# Patient Record
Sex: Male | Born: 1960 | Race: White | Hispanic: No | Marital: Married | Smoking: Never smoker
Health system: Southern US, Community
[De-identification: ages and names within clinical notes are randomized; demographics above are authoritative.]

## PROBLEM LIST (undated history)

## (undated) DIAGNOSIS — T7840XA Allergy, unspecified, initial encounter: Secondary | ICD-10-CM

## (undated) DIAGNOSIS — K645 Perianal venous thrombosis: Secondary | ICD-10-CM

## (undated) HISTORY — DX: Perianal venous thrombosis: K64.5

## (undated) HISTORY — DX: Allergy, unspecified, initial encounter: T78.40XA

---

## 2004-07-04 ENCOUNTER — Ambulatory Visit (HOSPITAL_COMMUNITY): Admission: RE | Admit: 2004-07-04 | Discharge: 2004-07-04 | Payer: Self-pay | Admitting: *Deleted

## 2012-05-24 ENCOUNTER — Emergency Department (INDEPENDENT_AMBULATORY_CARE_PROVIDER_SITE_OTHER)
Admission: EM | Admit: 2012-05-24 | Discharge: 2012-05-24 | Disposition: A | Discharge status: Home / Self Care | Payer: Managed Care, Other (non HMO) | Source: Home / Self Care

## 2012-05-24 DIAGNOSIS — S61219A Laceration without foreign body of unspecified finger without damage to nail, initial encounter: Secondary | ICD-10-CM

## 2012-05-24 DIAGNOSIS — S61209A Unspecified open wound of unspecified finger without damage to nail, initial encounter: Secondary | ICD-10-CM

## 2012-05-24 DIAGNOSIS — Z23 Encounter for immunization: Secondary | ICD-10-CM

## 2012-05-24 MED ORDER — TETANUS-DIPHTH-ACELL PERTUSSIS 5-2.5-18.5 LF-MCG/0.5 IM SUSP
0.5000 mL | Freq: Once | INTRAMUSCULAR | Status: AC
  Administered 2012-05-24: 0.5 mL via INTRAMUSCULAR

## 2012-05-24 NOTE — ED Provider Notes (Signed)
History     CSN: [id number]  Arrival date & time 05/24/12  1359   First MD Initiated Contact with Patient 05/24/12 1400      Chief Complaint  Patient presents with  . Laceration    today   HPI Comments: Pt was sharpening lawn mower blades. Had leather gloves on. Lawn mower blade slipped and cut him across R 3rd finger. Blade cut through leather gloves. Had superficial wound that bled small amount. Pt put a splint over finger. Bleeding returned. Pt states that he has not had a tetanus shot in > 10 years. Pt also wanted to make sure there were tendon/joint damage from injury.    Patient is a 51 y.o. male presenting with skin laceration.  Laceration  The incident occurred less than 1 hour ago. The laceration is located on the right hand. Size: 1.5 cm (superficial)    History reviewed. No pertinent past medical history.  History reviewed. No pertinent past surgical history.  Family History  Problem Relation Age of Onset  . Hypertension Mother     History  Substance Use Topics  . Smoking status: Never Smoker   . Smokeless tobacco: Never Used  . Alcohol Use: No      Review of Systems  All other systems reviewed and are negative.    Allergies  Review of patient's allergies indicates no known allergies.  Home Medications   Current Outpatient Rx  Name Route Sig Dispense Refill  . ASPIRIN 325 MG PO TABS Oral Take 325 mg by mouth as needed.      BP 116/76  Pulse 68  Temp 97.7 F (36.5 C) (Oral)  Resp 17  Ht 5\' 10"  (1.778 m)  Wt 160 lb (72.576 kg)  BMI 22.96 kg/m2  SpO2 100%  Physical Exam  Constitutional: He appears well-developed and well-nourished.  HENT:  Head: Normocephalic and atraumatic.  Eyes: Conjunctivae are normal. Pupils are equal, round, and reactive to light.  Neck: Normal range of motion. Neck supple.  Cardiovascular: Normal rate and regular rhythm.   Pulmonary/Chest: Effort normal and breath sounds normal.  Abdominal: Soft. Bowel sounds are  normal.  Musculoskeletal:       Hands:      Superficial laceration measuring around 1-1.5 cm across 1st IP joint of R 3rd metacarpal.  Laceration does not go below dermis    ED Course  LACERATION REPAIR Performed by: Doree Albee Authorized by: Doree Albee Consent: Verbal consent obtained. Patient understanding: patient states understanding of the procedure being performed Location: R 3rd metacarpal  Foreign bodies: no foreign bodies Tendon involvement: none Nerve involvement: none Vascular damage: no Patient sedated: no Irrigation solution: Area soaked and cleansed with chlorhexidine solution. Irrigation method: soaking, manual debridement.  Amount of cleaning: standard Debridement: minimal Degree of undermining: none Skin closure: glue Dressing: gauze roll Patient tolerance: Patient tolerated the procedure well with no immediate complications.   (including critical care time)  Labs Reviewed - No data to display No results found.   1. Need for tetanus booster   2. Finger laceration       MDM  Area manually cleansed and debrided at bedside.  No subdermal or joint space/tendinous involvement. Full ROM.  Dermabond applied to affected area.  Tetanus shot given.  Discussed general care and infectious red flags.  Follow up as needed.      The patient and/or caregiver has been counseled thoroughly with regard to treatment plan and/or medications prescribed including dosage, schedule, interactions, rationale for use,  and possible side effects and they verbalize understanding. Diagnoses and expected course of recovery discussed and will return if not improved as expected or if the condition worsens. Patient and/or caregiver verbalized understanding.             Doree Albee, MD 05/24/12 [phone]

## 2012-05-24 NOTE — ED Notes (Signed)
Treg cut his 3 rd finger on his right hand today. He was trying to sharpen a mower blade and the blade cut through his glove.

## 2014-07-09 ENCOUNTER — Ambulatory Visit
Admission: RE | Admit: 2014-07-09 | Discharge: 2014-07-09 | Disposition: A | Discharge status: Home / Self Care | Payer: Managed Care, Other (non HMO) | Source: Ambulatory Visit | Attending: Family Medicine | Admitting: Family Medicine

## 2014-07-09 ENCOUNTER — Other Ambulatory Visit: Payer: Self-pay | Admitting: Family Medicine

## 2014-07-09 DIAGNOSIS — M25532 Pain in left wrist: Secondary | ICD-10-CM

## 2015-07-30 IMAGING — CR DG WRIST COMPLETE 3+V*L*
4 series · 4 of 4 positions shown · non-contrast
Comparison: None.

CLINICAL DATA: Left wrist pain

EXAM:
LEFT WRIST - COMPLETE 3+ VIEW

[view not recorded (1 of 4)]
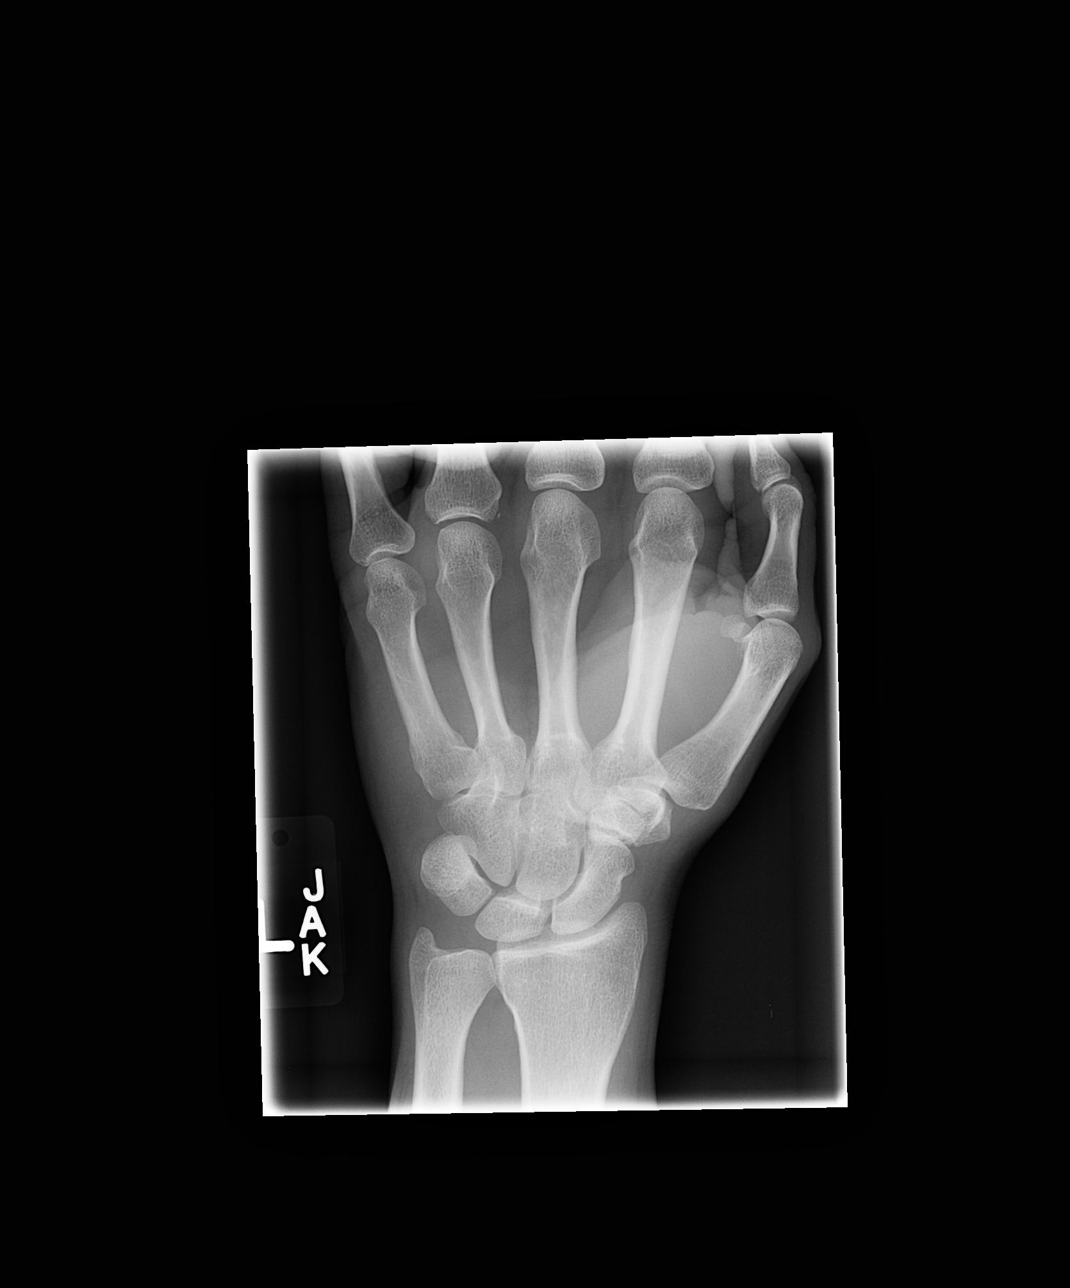

[view not recorded (2 of 4)]
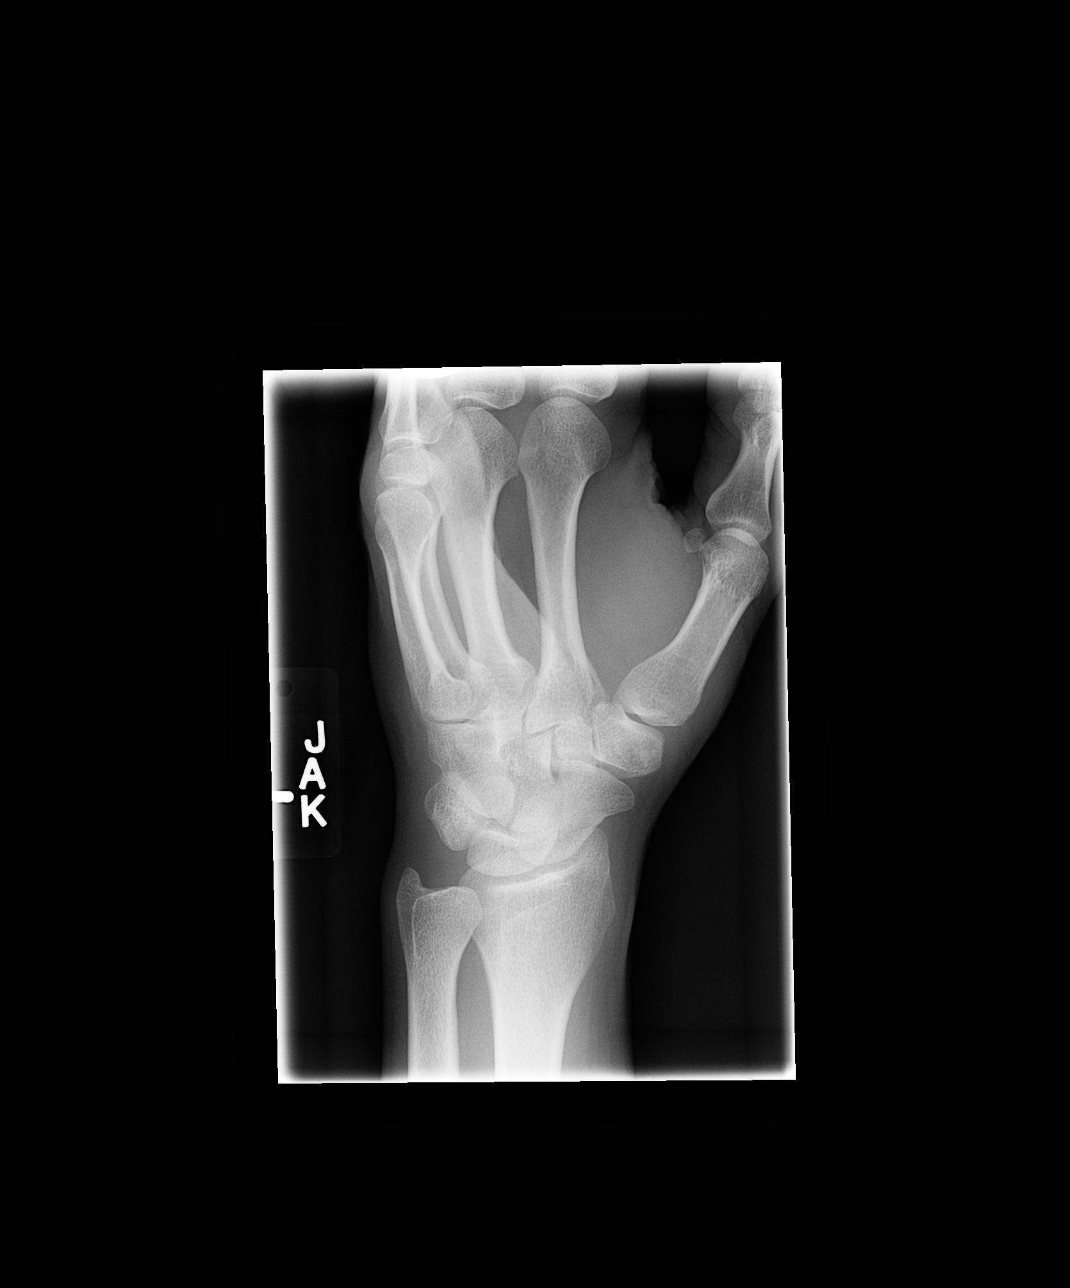

[view not recorded (3 of 4)]
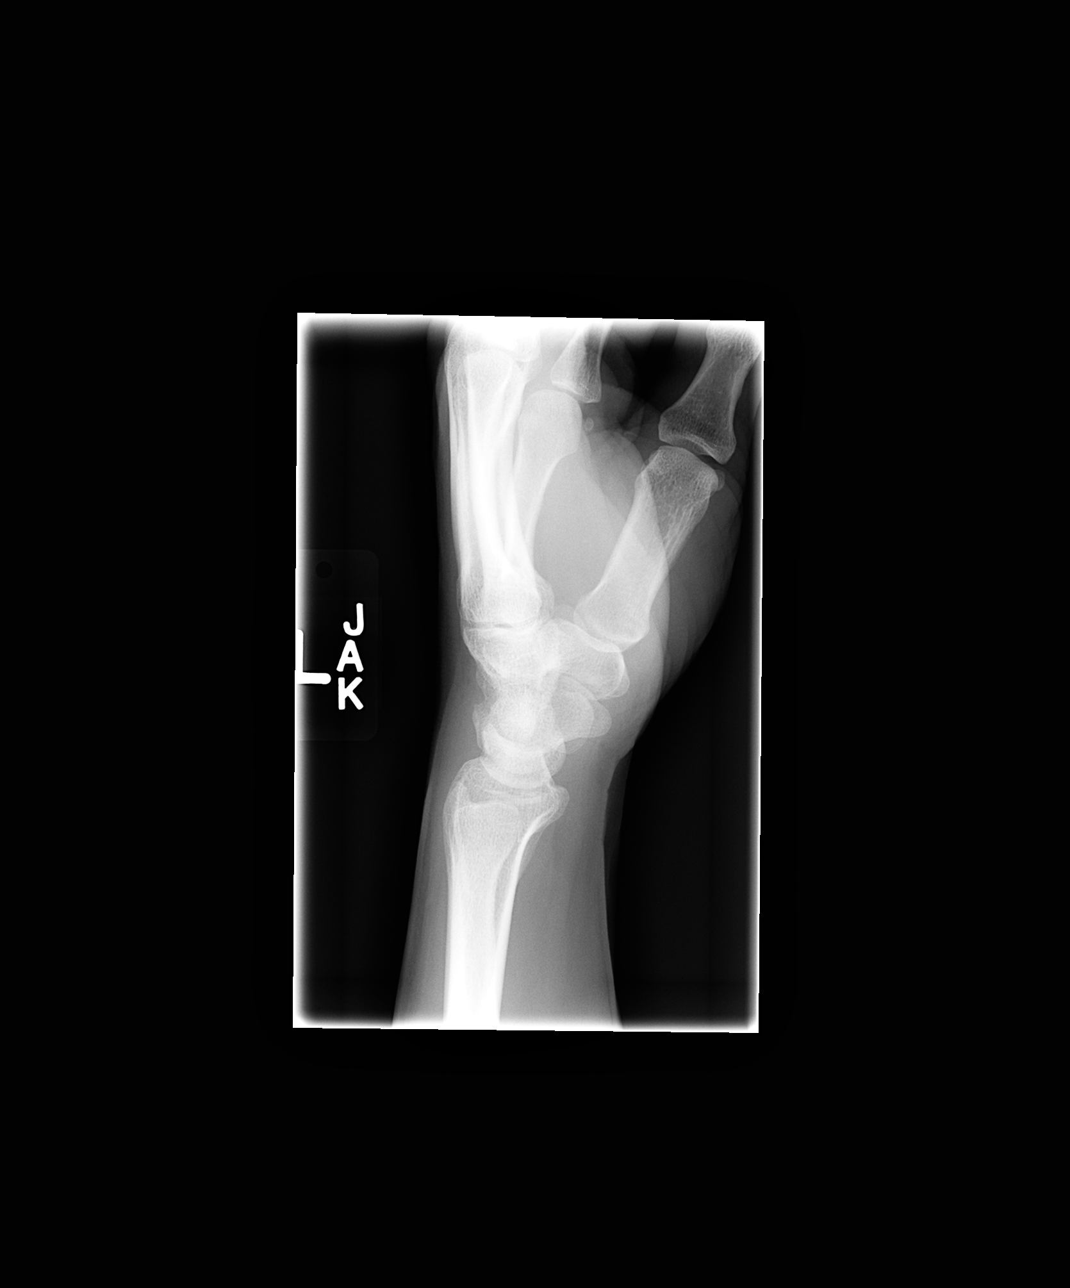

[view not recorded (4 of 4)]
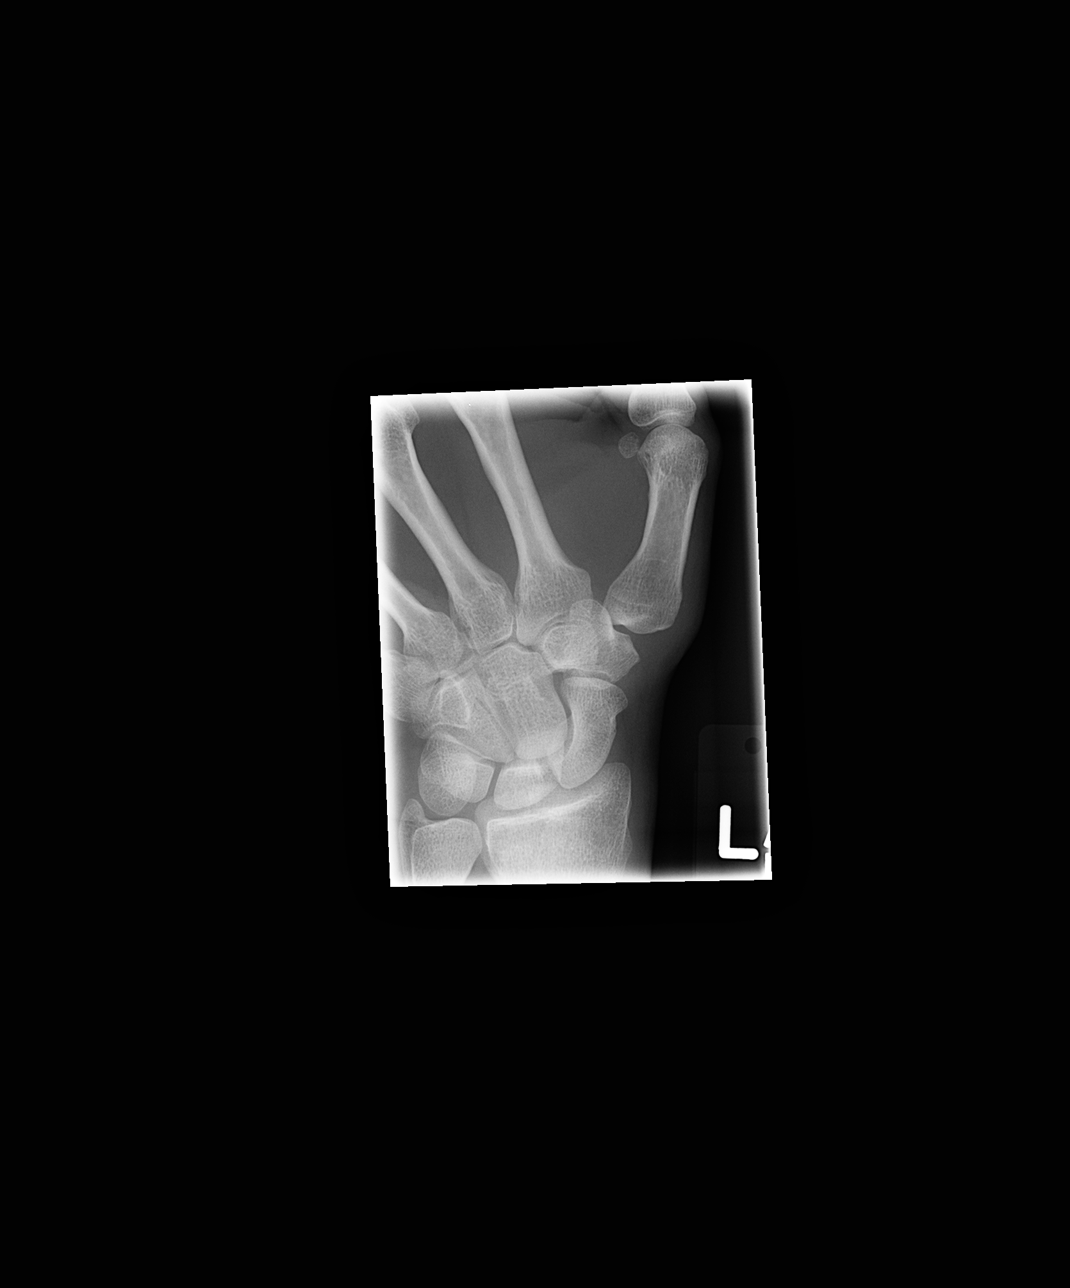

[4 of 4 positions shown; findings below may reference images not displayed]

FINDINGS: There is no evidence of fracture or dislocation. There is no
evidence of arthropathy or other focal bone abnormality. Soft
tissues are unremarkable.
IMPRESSION: Negative.

## 2023-10-17 ENCOUNTER — Ambulatory Visit: Payer: Commercial Managed Care - PPO

## 2023-10-17 ENCOUNTER — Encounter: Payer: Self-pay | Admitting: Podiatry

## 2023-10-17 ENCOUNTER — Other Ambulatory Visit: Payer: Self-pay

## 2023-10-17 ENCOUNTER — Ambulatory Visit: Payer: Commercial Managed Care - PPO | Admitting: Podiatry

## 2023-10-17 DIAGNOSIS — M79672 Pain in left foot: Secondary | ICD-10-CM

## 2023-10-17 DIAGNOSIS — G5762 Lesion of plantar nerve, left lower limb: Secondary | ICD-10-CM

## 2023-10-17 MED ORDER — MELOXICAM 15 MG PO TABS: 15.0000 mg | ORAL_TABLET | Freq: Every day | ORAL | 0 refills | Status: AC

## 2023-10-17 NOTE — Progress Notes (Signed)
  Subjective:  Patient ID: Benjamin Hoover, male    DOB: November 16, 1960,   MRN: [id number]  Chief Complaint  Patient presents with   Foot Pain    Pt presents for pain of left foot 3rd toe, states he stepped on his daughters toy about a month ago and he thinks he injured his toe. " I feel like I am stepping on the marbles when I walk".    63 y.o. male presents for concern as above. Relates he has had numbness and tingling in the toe. Relates mostly hurts after walking about a mile it will start to bother him so has limited his walking. He has taken naproxen with little help. Denies any other treatments . Denies any other pedal complaints. Denies n/v/f/c.   Past Medical History:  Diagnosis Date   Allergy    Thrombosed hemorrhoids    I was able to lance this without difficulty today. Wound care has been discussed with him    Objective:  Physical Exam: Vascular: DP/PT pulses 2/4 bilateral. CFT <3 seconds. Normal hair growth on digits. No edema.  Skin. No lacerations or abrasions bilateral feet.  Musculoskeletal: MMT 5/5 bilateral lower extremities in DF, PF, Inversion and Eversion. Deceased ROM in DF of ankle joint. Tender to third interspace on the left. Mildy pain with metatarsal squeeze. Mild positive mulders click. There is some mild tenderness to the third MPJ. No pain with ROM of the third and fourth MPJ. No pain to the fourth MPJ.  Neurological: Sensation intact to light touch.   Assessment:   1. Morton neuroma of left foot      Plan:  Patient was evaluated and treated and all questions answered. Discussed neuroma and treatment options with patient.  Radiographs reviewed and discussed with patient. No acute fractures or dislocations noted.  Injection deferred today. .  Discussed padding and offloading today.  Prescription for meloxicam provided.  Discussed if pain does not improve may consider  MRI for further surgical planning.  Patient to return in 6 weeks or sooner if concerns  arise.     Louann Sjogren, DPM

## 2023-11-29 ENCOUNTER — Ambulatory Visit: Payer: Commercial Managed Care - PPO | Admitting: Podiatry

## 2023-11-29 DIAGNOSIS — G5762 Lesion of plantar nerve, left lower limb: Secondary | ICD-10-CM | POA: Diagnosis not present

## 2023-11-29 NOTE — Progress Notes (Signed)
  Subjective:  Patient ID: LYNWOOD KUBISIAK, male    DOB: 10-10-1960,   MRN: [id number]  No chief complaint on file.   63 y.o. male presents for follow-up of left foot neuroma. Relates doing a lot better and padding has really helped. Did have to adjust the way he wore them. States he did take the meloxicam and difficult to tell if that really made a difference. Does relate he has some numbness in his fourth and some of his fifth toe however and wondering if this is related and will improve.  . Denies any other pedal complaints. Denies n/v/f/c.   Past Medical History:  Diagnosis Date   Allergy    Thrombosed hemorrhoids    I was able to lance this without difficulty today. Wound care has been discussed with him    Objective:  Physical Exam: Vascular: DP/PT pulses 2/4 bilateral. CFT <3 seconds. Normal hair growth on digits. No edema.  Skin. No lacerations or abrasions bilateral feet.  Musculoskeletal: MMT 5/5 bilateral lower extremities in DF, PF, Inversion and Eversion. Deceased ROM in DF of ankle joint. Non tender to third interspace on the left. Mildy pain with metatarsal squeeze.  Minimal  positive mulders click. There is some mild tenderness to the third MPJ. No pain with ROM of the third and fourth MPJ. No pain to the fourth MPJ.  Neurological: Sensation intact to light touch.   Assessment:   1. Morton neuroma of left foot       Plan:  Patient was evaluated and treated and all questions answered. Discussed neuroma and treatment options with patient.  Radiographs reviewed and discussed with patient. No acute fractures or dislocations noted.  Injection deferred today. .  Continue padding and offloading  CMO fitted today.  Discussed if pain does not improve may consider  MRI for further surgical planning.  Patient to return as needed    Louann Sjogren, DPM

## 2024-01-15 ENCOUNTER — Telehealth: Payer: Self-pay

## 2024-01-15 NOTE — Telephone Encounter (Signed)
 Sending orthotics to [facility]. I left message for patient as well  Britton Cane CPed, CFo, CFm

## 2024-01-31 ENCOUNTER — Ambulatory Visit: Admitting: Podiatry

## 2024-01-31 ENCOUNTER — Encounter: Payer: Self-pay | Admitting: Podiatry

## 2024-01-31 DIAGNOSIS — G5763 Lesion of plantar nerve, bilateral lower limbs: Secondary | ICD-10-CM | POA: Diagnosis not present

## 2024-01-31 DIAGNOSIS — G5762 Lesion of plantar nerve, left lower limb: Secondary | ICD-10-CM

## 2024-01-31 NOTE — Progress Notes (Signed)
  Subjective:  Patient ID: Benjamin Hoover, male    DOB: 1961/01/06,   MRN: [id number]  No chief complaint on file.   63 y.o. male presents for follow-up of left foot neuroma. Relates doing well. Here to pick up orthotics. . Does relate he has some numbness and pressure in his fourth and some of his fifth toe however and wondering if this is related and will improve.  . Denies any other pedal complaints. Denies n/v/f/c.   Past Medical History:  Diagnosis Date   Allergy    Thrombosed hemorrhoids    I was able to lance this without difficulty today. Wound care has been discussed with him    Objective:  Physical Exam: Vascular: DP/PT pulses 2/4 bilateral. CFT <3 seconds. Normal hair growth on digits. No edema.  Skin. No lacerations or abrasions bilateral feet.  Musculoskeletal: MMT 5/5 bilateral lower extremities in DF, PF, Inversion and Eversion. Deceased ROM in DF of ankle joint. Non tender to third interspace on the left. Mildy pain with metatarsal squeeze.  Minimal  positive mulders click. There is some mild tenderness to the third MPJ. No pain with ROM of the third and fourth MPJ. No pain to the fourth MPJ.  Neurological: Sensation intact to light touch.   Assessment:   1. Morton neuroma of left foot       Plan:  Patient was evaluated and treated and all questions answered. Discussed neuroma and treatment options with patient.  Radiographs reviewed and discussed with patient. No acute fractures or dislocations noted.  Injection deferred today. .  Continue padding and offloading  CMO fitted dispensed today. Advised on wear and break in and to call back with any trouble.  Discussed if pain does not improve may consider  MRI for further surgical planning.  Patient to return as needed    Jennefer Moats, DPM
